# Patient Record
Sex: Female | Born: 1995 | Race: White | Hispanic: No | Marital: Single | Smoking: Never smoker
Health system: Southern US, Community
[De-identification: ages and names within clinical notes are randomized; demographics above are authoritative.]

---
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
## 2016-04-05 ENCOUNTER — Encounter (HOSPITAL_COMMUNITY): Payer: Self-pay | Admitting: *Deleted

## 2016-04-05 DIAGNOSIS — R1031 Right lower quadrant pain: Secondary | ICD-10-CM | POA: Diagnosis present

## 2016-04-05 DIAGNOSIS — Z3202 Encounter for pregnancy test, result negative: Secondary | ICD-10-CM | POA: Insufficient documentation

## 2016-04-05 DIAGNOSIS — N12 Tubulo-interstitial nephritis, not specified as acute or chronic: Secondary | ICD-10-CM | POA: Insufficient documentation

## 2016-04-05 LAB — COMPREHENSIVE METABOLIC PANEL
ALBUMIN: 4.4 g/dL (ref 3.5–5.0)
ALT: 15 U/L (ref 14–54)
ANION GAP: 9 (ref 5–15)
AST: 18 U/L (ref 15–41)
Alkaline Phosphatase: 57 U/L (ref 38–126)
BUN: 11 mg/dL (ref 6–20)
CHLORIDE: 105 mmol/L (ref 101–111)
CO2: 26 mmol/L (ref 22–32)
Calcium: 9.6 mg/dL (ref 8.9–10.3)
Creatinine, Ser: 0.67 mg/dL (ref 0.44–1.00)
GFR calc Af Amer: >60 mL/min (ref >60)
GFR calc non Af Amer: >60 mL/min (ref >60)
GLUCOSE: 92 mg/dL (ref 65–99)
POTASSIUM: 4.1 mmol/L (ref 3.5–5.1)
SODIUM: 140 mmol/L (ref 135–145)
TOTAL PROTEIN: 7.6 g/dL (ref 6.5–8.1)
Total Bilirubin: 0.5 mg/dL (ref 0.3–1.2)

## 2016-04-05 LAB — LIPASE, BLOOD: LIPASE: 21 U/L (ref 11–51)

## 2016-04-05 LAB — URINALYSIS, ROUTINE W REFLEX MICROSCOPIC
BILIRUBIN URINE: NEGATIVE
GLUCOSE, UA: NEGATIVE mg/dL
Ketones, ur: NEGATIVE mg/dL
NITRITE: NEGATIVE
PH: 6.5 (ref 5.0–8.0)
Protein, ur: 100 mg/dL — AB
SPECIFIC GRAVITY, URINE: 1.017 (ref 1.005–1.030)

## 2016-04-05 LAB — CBC
HEMATOCRIT: 35.9 % — AB (ref 36.0–46.0)
HEMOGLOBIN: 12.7 g/dL (ref 12.0–15.0)
MCH: 30.5 pg (ref 26.0–34.0)
MCHC: 35.4 g/dL (ref 30.0–36.0)
MCV: 86.1 fL (ref 78.0–100.0)
Platelets: 352 10*3/uL (ref 150–400)
RBC: 4.17 MIL/uL (ref 3.87–5.11)
RDW: 13.1 % (ref 11.5–15.5)
WBC: 8.6 10*3/uL (ref 4.0–10.5)

## 2016-04-05 LAB — URINE MICROSCOPIC-ADD ON

## 2016-04-05 NOTE — ED Notes (Signed)
Pt complains of RLQ all day, worse for the past 5 hours. Pt denies N/V/D. LMP was Monday. Pt still has appendix.

## 2016-04-06 ENCOUNTER — Emergency Department (HOSPITAL_COMMUNITY)
Admission: EM | Admit: 2016-04-06 | Discharge: 2016-04-06 | Disposition: A | Discharge status: Home / Self Care | Payer: BC Managed Care – PPO | Attending: Emergency Medicine | Admitting: Emergency Medicine

## 2016-04-06 ENCOUNTER — Encounter (HOSPITAL_COMMUNITY): Payer: Self-pay

## 2016-04-06 ENCOUNTER — Emergency Department (HOSPITAL_COMMUNITY): Payer: BC Managed Care – PPO

## 2016-04-06 DIAGNOSIS — N12 Tubulo-interstitial nephritis, not specified as acute or chronic: Secondary | ICD-10-CM

## 2016-04-06 LAB — PREGNANCY, URINE: PREG TEST UR: NEGATIVE

## 2016-04-06 MED ORDER — IBUPROFEN 600 MG PO TABS: 600.0000 mg | ORAL_TABLET | Freq: Four times a day (QID) | ORAL | Status: DC | PRN

## 2016-04-06 MED ORDER — SODIUM CHLORIDE 0.9 % IV BOLUS (SEPSIS)
1000.0000 mL | Freq: Once | INTRAVENOUS | Status: AC
  Administered 2016-04-06: 1000 mL via INTRAVENOUS

## 2016-04-06 MED ORDER — KETOROLAC TROMETHAMINE 30 MG/ML IJ SOLN
30.0000 mg | Freq: Once | INTRAMUSCULAR | Status: AC
  Administered 2016-04-06: 30 mg via INTRAVENOUS
  Filled 2016-04-06: qty 1

## 2016-04-06 MED ORDER — CEPHALEXIN 500 MG PO CAPS: 500.0000 mg | ORAL_CAPSULE | Freq: Four times a day (QID) | ORAL | Status: DC

## 2016-04-06 MED ORDER — IOPAMIDOL (ISOVUE-300) INJECTION 61%
100.0000 mL | Freq: Once | INTRAVENOUS | Status: AC | PRN
  Administered 2016-04-06: 100 mL via INTRAVENOUS

## 2016-04-06 MED ORDER — DEXTROSE 5 % IV SOLN
1.0000 g | Freq: Once | INTRAVENOUS | Status: AC
  Administered 2016-04-06: 1 g via INTRAVENOUS
  Filled 2016-04-06: qty 10

## 2016-04-06 NOTE — ED Notes (Signed)
Pt transported to CT ?

## 2016-04-06 NOTE — ED Provider Notes (Signed)
CSN: [id number]     Arrival date & time 04/05/16  2139 History   First MD Initiated Contact with Patient 04/06/16 0217     Chief Complaint  Patient presents with  . Abdominal Pain     (Consider location/radiation/quality/duration/timing/severity/associated sxs/prior Treatment) HPI Comments: 20 year old female with no significant past medical history presents to the emergency department for further evaluation of abdominal pain. Patient reports that she had some suprapubic cramping yesterday. This progressed to right lower quadrant and back pain at approximately 1600 today. Pain has been constant and waxing and waning in severity. Patient reports associated chills with a temperature of 5F. No known fever prior to arrival. Patient has not taken any medications for her symptoms today. She denies hematuria, dysuria, nausea, vomiting, vaginal bleeding or other vaginal complaints, or ever being sexually active. She has no personal history of kidney stones, though her father has had kidney stones. No history of abdominal surgeries.  Patient is a 20 y.o. female presenting with abdominal pain. The history is provided by the patient. No language interpreter was used.  Abdominal Pain   History reviewed. No pertinent past medical history. History reviewed. No pertinent past surgical history. Family History  Problem Relation Age of Onset  . Heart disease Maternal Grandmother   . Cancer Maternal Grandmother   . Heart disease Maternal Grandfather   . Cancer Paternal Grandmother   . Hypertension Paternal Grandmother    Social History  Substance Use Topics  . Smoking status: Never Smoker   . Smokeless tobacco: None  . Alcohol Use: No   OB History    No data available      Review of Systems  Gastrointestinal: Positive for abdominal pain.  Genitourinary: Positive for flank pain.  All other systems reviewed and are negative.   Allergies  Review of patient's allergies indicates no known  allergies.  Home Medications   Prior to Admission medications   Medication Sig Start Date End Date Taking? Authorizing Provider  cephALEXin (KEFLEX) 500 MG capsule Take 1 capsule (500 mg total) by mouth 4 (four) times daily. 04/06/16   Antony MaduraKelly Cleofas Hudgins, PA-C  ibuprofen (ADVIL,MOTRIN) 600 MG tablet Take 1 tablet (600 mg total) by mouth every 6 (six) hours as needed. 04/06/16   Antony MaduraKelly Aseret Hoffman, PA-C   BP 105/63 mmHg  Pulse 68  Temp(Src) 99 F (37.2 C) (Oral)  Resp 14  SpO2 99%  LMP 04/02/2016   Physical Exam  Constitutional: She is oriented to person, place, and time. She appears well-developed and well-nourished. No distress.  Nontoxic appearing. Pleasant.  HENT:  Head: Normocephalic and atraumatic.  Eyes: Conjunctivae and EOM are normal. No scleral icterus.  Neck: Normal range of motion.  Cardiovascular: Normal rate, regular rhythm and intact distal pulses.   Pulmonary/Chest: Effort normal. No respiratory distress. She has no wheezes. She has no rales.  Respirations even and unlabored. Lungs CTAB.  Abdominal: Soft. She exhibits no distension. There is tenderness. There is no rebound and no guarding.  Soft abdomen with tenderness to palpation in the right lower quadrant. There is positive CVA tenderness on the right. No masses or rigidity. No guarding appreciated. Negative Murphy sign.  Musculoskeletal: Normal range of motion.  Neurological: She is alert and oriented to person, place, and time. She exhibits normal muscle tone. Coordination normal.  Patient moving all extremities.  Skin: Skin is warm and dry. No rash noted. She is not diaphoretic. No erythema. No pallor.  Psychiatric: She has a normal mood and affect. Her behavior  is normal.  Nursing note and vitals reviewed.   ED Course  Procedures (including critical care time) Labs Review Labs Reviewed  CBC - Abnormal; Notable for the following:    HCT 35.9 (*)    All other components within normal limits  URINALYSIS, ROUTINE W  REFLEX MICROSCOPIC (NOT AT [facility]) - Abnormal; Notable for the following:    APPearance CLOUDY (*)    Hgb urine dipstick LARGE (*)    Protein, ur 100 (*)    Leukocytes, UA MODERATE (*)    All other components within normal limits  URINE MICROSCOPIC-ADD ON - Abnormal; Notable for the following:    Squamous Epithelial / LPF 0-5 (*)    Bacteria, UA FEW (*)    All other components within normal limits  URINE CULTURE  LIPASE, BLOOD  COMPREHENSIVE METABOLIC PANEL  PREGNANCY, URINE    Imaging Review Ct Abdomen Pelvis W Contrast  04/06/2016  CLINICAL DATA:  Right lower quadrant pain, worsening over the past several hours. EXAM: CT ABDOMEN AND PELVIS WITH CONTRAST TECHNIQUE: Multidetector CT imaging of the abdomen and pelvis was performed using the standard protocol following bolus administration of intravenous contrast. CONTRAST:  ISOVUE-300 IOPAMIDOL (ISOVUE-300) INJECTION 61% COMPARISON:  None. FINDINGS: Probable visualization of the normal appendix. No inflammatory changes in the region of the cecal tip. No acute inflammatory changes elsewhere in the abdomen or pelvis. There is no bowel obstruction. There is no extraluminal air. There are unremarkable appearances of the liver, biliary system, pancreas, spleen, adrenals and kidneys. Vasculature is unremarkable. There is no significant abnormality in the lower chest. There is no significant musculoskeletal abnormality. No urinary calculi. IMPRESSION: No acute findings are evident in the abdomen or pelvis. Probable visualization of a normal appendix. No urinary calculi. Electronically Signed   By: Ellery Plunk M.D.   On: 04/06/2016 04:20     I have personally reviewed and evaluated these images and lab results as part of my medical decision-making.   EKG Interpretation None       Medications  ketorolac (TORADOL) 30 MG/ML injection 30 mg (30 mg Intravenous Given 04/06/16 0300)  sodium chloride 0.9 % bolus 1,000 mL (0 mLs Intravenous  Stopped 04/06/16 0452)  cefTRIAXone (ROCEPHIN) 1 g in dextrose 5 % 50 mL IVPB (0 g Intravenous Stopped 04/06/16 0347)  iopamidol (ISOVUE-300) 61 % injection 100 mL (100 mLs Intravenous Contrast Given 04/06/16 0348)    MDM   Final diagnoses:  Pyelonephritis    20 year old female presents to the emergency department for evaluation of 2 days of suprapubic abdominal pain worsening to include right lower quadrant pain and right flank pain. Urinalysis today consistent with hemorrhagic cystitis. Kidney stone considered, but there is no evidence of urinary calculi on CT scan. Appendix is also visualized to be normal. Patient is afebrile and without leukocytosis. No associated N/V. Given flank pain, suspect early pyelonephritis. The patient has been given an initial dose of Rocephin in the emergency department. Plan to discharge with ibuprofen and Keflex. Pediatric follow-up advised and return precautions given. Patient and mother agreeable to plan with no unaddressed concerns. Patient discharged in good condition.   Filed Vitals:   04/05/16 2222 04/06/16 0302 04/06/16 0452  BP: 127/73 111/68 105/63  Pulse: 70 91 68  Temp: 99 F (37.2 C)    TempSrc: Oral    Resp: SpO2: 99% 97% 99%     Antony Madura, PA-C 04/06/16 1610  Dione Booze, MD 04/06/16 [phone]

## 2016-04-06 NOTE — Discharge Instructions (Signed)

## 2016-04-08 LAB — URINE CULTURE

## 2016-04-09 ENCOUNTER — Telehealth (HOSPITAL_BASED_OUTPATIENT_CLINIC_OR_DEPARTMENT_OTHER): Payer: Self-pay | Admitting: Emergency Medicine

## 2016-04-09 NOTE — Telephone Encounter (Signed)
Post ED Visit - Positive Culture Follow-up  Culture report reviewed by antimicrobial stewardship pharmacist:  []  Enzo BiNathan Batchelder, Pharm.D. []  Celedonio MiyamotoJeremy Frens, Pharm.D., BCPS []  Garvin FilaMike Maccia, Pharm.D. []  Georgina PillionElizabeth Martin, Pharm.D., BCPS []  ClearwaterMinh Pham, [address] Rainbow BoulevardPharm.D., BCPS, AAHIVP []  Estella HuskMichelle Turner, Pharm.D., BCPS, AAHIVP []  Tennis Mustassie Stewart, Pharm.D. []  Sherle Poeob Vincent, [place]. Meagan Decker PharmD  Positive urine culture Treated with cephalexin, organism sensitive to the same and no further patient follow-up is required at this time.  Berle MullMiller, Arleatha Philipps 04/09/2016, 11:12 AM

## 2016-05-02 ENCOUNTER — Ambulatory Visit (INDEPENDENT_AMBULATORY_CARE_PROVIDER_SITE_OTHER): Payer: BC Managed Care – PPO | Admitting: Physician Assistant

## 2016-05-02 VITALS — BP 106/72 | HR 122 | Temp 98.7°F | Resp 18 | Ht 67.0 in | Wt 161.0 lb

## 2016-05-02 DIAGNOSIS — J029 Acute pharyngitis, unspecified: Secondary | ICD-10-CM

## 2016-05-02 DIAGNOSIS — R6889 Other general symptoms and signs: Secondary | ICD-10-CM | POA: Diagnosis not present

## 2016-05-02 LAB — POCT INFLUENZA A/B
INFLUENZA B, POC: NEGATIVE
Influenza A, POC: NEGATIVE

## 2016-05-02 LAB — POCT RAPID STREP A (OFFICE): RAPID STREP A SCREEN: NEGATIVE

## 2016-05-02 NOTE — Patient Instructions (Addendum)
Mucinex for congestion Rest Tylenol or motrin for fever and body aches    IF you received an x-ray today, you will receive an invoice from [facility]. Please contact [facility] at [phone] with questions or concerns regarding your invoice.   IF you received labwork today, you will receive an invoice from [facility]. Please contact [facility] at [phone] with questions or concerns regarding your invoice.   Our billing staff will not be able to assist you with questions regarding bills from these companies.  You will be contacted with the lab results as soon as they are available. The fastest way to get your results is to activate your My Chart account. Instructions are located on the last page of this paperwork. If you have not heard from us regarding the results in 2 weeks, please contact this office.

## 2016-05-02 NOTE — Progress Notes (Signed)
   Jacqueline Barrett  MRN: [id number] DOB: 07/04/1996  Subjective:  Pt presents to clinic with 5 days h/o not feeling good.  Started with a fever 4 days ago that has not resolved.  She moved out of [place] the day she started to get sick.  She has had intermittent fevers with sore throat and congestion with minimal rhinorrhea.  She had had some nausea but she has been sleeping a lot.  She has had headaches and myalgias.  Mother would like to have her tested for the Flu because the patient's grandfather is now sick with similar symptoms.  Hone treatment - tylenol and advil sick - no strep contacts and now flu contacts is over a month  There are no active problems to display for this patient.   No current outpatient prescriptions on file prior to visit.   No current facility-administered medications on file prior to visit.    No Known Allergies  Review of Systems  Constitutional: Positive for fever (102), chills and appetite change.  HENT: Positive for congestion, postnasal drip and sore throat. Negative for rhinorrhea.   Respiratory: Positive for cough.   Gastrointestinal: Positive for nausea. Negative for diarrhea and constipation.  Musculoskeletal: Positive for myalgias.  Neurological: Positive for headaches.   Objective:  BP 106/72 mmHg  Pulse 122  Temp(Src) 98.7 F (37.1 C) (Oral)  Resp 18  Ht 5\' 7"  (1.702 m)  Wt 161 lb (73.029 kg)  BMI 25.21 kg/m2  SpO2 95%  LMP 03/29/2016  Physical Exam  Constitutional: She is oriented to person, place, and time and well-developed, well-nourished, and in no distress.  HENT:  Head: Normocephalic and atraumatic.  Right Ear: Hearing, tympanic membrane, external ear and ear canal normal.  Left Ear: Hearing, tympanic membrane, external ear and ear canal normal.  Nose: Nose normal.  Mouth/Throat: Uvula is midline, oropharynx is clear and moist and mucous membranes are normal.  Eyes: Conjunctivae are normal.  Neck: Normal range of motion.   Cardiovascular: Normal rate, regular rhythm and normal heart sounds.   No murmur heard. Pulmonary/Chest: Effort normal and breath sounds normal.  Neurological: She is alert and oriented to person, place, and time. Gait normal.  Skin: Skin is warm and dry.  Psychiatric: Mood, memory, affect and judgment normal.  Vitals reviewed.  Results for orders placed or performed in visit on 05/02/16  POCT Influenza A/B  Result Value Ref Range   Influenza A, POC Negative Negative   Influenza B, POC Negative Negative  POCT rapid strep A  Result Value Ref Range   Rapid Strep A Screen Negative Negative    Assessment and Plan :  Sore throat - Plan: POCT rapid strep A  Flu-like symptoms - Plan: POCT Influenza A/B   Symptomatic care with mucinex and NSAIDs for the fever.  Questions were answered.  Benny LennertSarah Jerrick Farve PA-C  [facility] 05/02/2016 10:25 AM

## 2016-11-14 ENCOUNTER — Encounter: Payer: Self-pay | Admitting: Family Medicine

## 2016-11-14 ENCOUNTER — Ambulatory Visit (INDEPENDENT_AMBULATORY_CARE_PROVIDER_SITE_OTHER): Payer: BC Managed Care – PPO | Admitting: Family Medicine

## 2016-11-14 VITALS — BP 110/60 | HR 60 | Temp 98.7°F | Ht 67.0 in | Wt 165.4 lb

## 2016-11-14 DIAGNOSIS — Z23 Encounter for immunization: Secondary | ICD-10-CM | POA: Diagnosis not present

## 2016-11-14 DIAGNOSIS — R21 Rash and other nonspecific skin eruption: Secondary | ICD-10-CM | POA: Diagnosis not present

## 2016-11-14 DIAGNOSIS — Z131 Encounter for screening for diabetes mellitus: Secondary | ICD-10-CM | POA: Diagnosis not present

## 2016-11-14 DIAGNOSIS — Z0000 Encounter for general adult medical examination without abnormal findings: Secondary | ICD-10-CM

## 2016-11-14 DIAGNOSIS — N946 Dysmenorrhea, unspecified: Secondary | ICD-10-CM

## 2016-11-14 DIAGNOSIS — Z13220 Encounter for screening for lipoid disorders: Secondary | ICD-10-CM

## 2016-11-14 LAB — POCT URINALYSIS DIP (MANUAL ENTRY)
BILIRUBIN UA: NEGATIVE
BILIRUBIN UA: NEGATIVE
Glucose, UA: NEGATIVE
Leukocytes, UA: NEGATIVE
Nitrite, UA: NEGATIVE
PH UA: 7
Protein Ur, POC: NEGATIVE
Spec Grav, UA: 1.02
Urobilinogen, UA: 0.2

## 2016-11-14 MED ORDER — NORETHINDRONE ACET-ETHINYL EST 1-20 MG-MCG PO TABS: 1.0000 | ORAL_TABLET | Freq: Every day | ORAL | 3 refills | Status: DC

## 2016-11-14 NOTE — Progress Notes (Signed)
Subjective:    Patient ID: Jacqueline Barrett, female    DOB: 09/08/1996, 20 y.o.   MRN: [id number]  11/14/2016  Annual Exam   HPI This 20 y.o. female presents for Complete Physical Examination.  Last physical: 06/2014 TDAP: Gardisil: in past. Influenza: requesting Eye exam:  Never; no glasses or contacts Dental exam:  Every year First day of menses has moderate cramping and heavy menses.  Seven days of menses. Regular.   Immunization History  Administered Date(s) Administered  . Influenza,inj,Quad PF,36+ Mos 11/14/2016   Wt Readings from Last 3 Encounters:  11/14/16 165 lb 6.4 oz (75 kg) (90 %, Z= 1.29)*  05/02/16 161 lb (73 kg) (89 %, Z= 1.22)*  07/22/14 156 lb 3.2 oz (70.9 kg) (89 %, Z= 1.23)*   * Growth percentiles are based on CDC 2-20 Years data.   BP Readings from Last 3 Encounters:  11/14/16 110/60  05/02/16 106/72  04/06/16 105/63    No exam data present  UTI in 03/2016; s/p CT scan WNL.  Inflamed rash: recommended taking Zyrtec 10mg  daily; very drowsy.  No sensitive skin.  Dove soap for sensitive skin.  Also happens on face.  Not seasonal; yearround. Onset in high school.  Review of Systems  Constitutional: Negative for activity change, appetite change, chills, diaphoresis, fatigue, fever and unexpected weight change.  HENT: Negative for congestion, dental problem, drooling, ear discharge, ear pain, facial swelling, hearing loss, mouth sores, nosebleeds, postnasal drip, rhinorrhea, sinus pressure, sneezing, sore throat, tinnitus, trouble swallowing and voice change.   Eyes: Negative for photophobia, pain, discharge, redness, itching and visual disturbance.  Respiratory: Negative for apnea, cough, choking, chest tightness, shortness of breath, wheezing and stridor.   Cardiovascular: Negative for chest pain, palpitations and leg swelling.  Gastrointestinal: Positive for abdominal pain and nausea. Negative for abdominal distention, anal bleeding, blood in stool,  constipation, diarrhea, rectal pain and vomiting.  Endocrine: Negative for cold intolerance, heat intolerance, polydipsia, polyphagia and polyuria.  Genitourinary: Negative for decreased urine volume, difficulty urinating, dyspareunia, dysuria, enuresis, flank pain, frequency, genital sores, hematuria, menstrual problem, pelvic pain, urgency, vaginal bleeding, vaginal discharge and vaginal pain.  Musculoskeletal: Negative for arthralgias, back pain, gait problem, joint swelling, myalgias, neck pain and neck stiffness.  Skin: Positive for color change and rash. Negative for pallor and wound.  Allergic/Immunologic: Negative for environmental allergies, food allergies and immunocompromised state.  Neurological: Negative for dizziness, tremors, seizures, syncope, facial asymmetry, speech difficulty, weakness, light-headedness, numbness and headaches.  Hematological: Negative for adenopathy. Does not bruise/bleed easily.  Psychiatric/Behavioral: Negative for agitation, behavioral problems, confusion, decreased concentration, dysphoric mood, hallucinations, self-injury, sleep disturbance and suicidal ideas. The patient is not nervous/anxious and is not hyperactive.        Bedtime 11:00pm; wakes up 8:30am.    History reviewed. No pertinent past medical history. History reviewed. No pertinent surgical history. No Known Allergies  Social History   Social History  . Marital status: Single    Spouse name: N/A  . Number of children: N/A  . Years of education: N/A   Occupational History  . Not on file.   Social History Main Topics  . Smoking status: Never Smoker  . Smokeless tobacco: Never Used  . Alcohol use No  . Drug use: No  . Sexual activity: No   Other Topics Concern  . 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      Tobacco; none      Alcohol: socially      Drugs: none      Seatbelt: 100%; no texting      Exercise: running; two days per week.      Sexual activity:    Family History  Problem Relation Age of Onset  . Mental illness Father     bipolar  . Diabetes Father   . Arthritis Father   . Heart disease Maternal Grandmother   . Cancer Maternal Grandmother   . Heart disease Maternal Grandfather   . Cancer Paternal Grandmother   . Hypertension Paternal Grandmother        Objective:    BP 110/60 (BP Location: Right Arm, Patient Position: Sitting, Cuff Size: Small)   Pulse 60   Temp 98.7 F (37.1 C) (Oral)   Ht 5\' 7"  (1.702 m)   Wt 165 lb 6.4 oz (75 kg)   BMI 25.91 kg/m  Physical Exam  Constitutional: She is oriented to person, place, and time. She appears well-developed and well-nourished. No distress.  HENT:  Head: Normocephalic and atraumatic.  Right Ear: External ear normal.  Left Ear: External ear normal.  Nose: Nose normal.  Mouth/Throat: Oropharynx is clear and moist.  Eyes: Conjunctivae and EOM are normal. Pupils are equal, round, and reactive to light.  Neck: Normal range of motion and full passive range of motion without pain. Neck supple. No JVD present. Carotid bruit is not present. No thyromegaly present.  Cardiovascular: Normal rate, regular rhythm and normal heart sounds.  Exam reveals no gallop and no friction rub.   No murmur heard. Pulmonary/Chest: Effort normal and breath sounds normal. She has no wheezes. She has no rales.  Abdominal: Soft. Bowel sounds are normal. She exhibits no distension and no mass. There is no tenderness. There is no rebound and no guarding.  Musculoskeletal:       Right shoulder: Normal.       Left shoulder: Normal.       Cervical back: Normal.  Lymphadenopathy:    She has no cervical adenopathy.  Neurological: She is alert and oriented to person, place, and  time. She has normal reflexes. No cranial nerve deficit. She exhibits normal muscle tone. Coordination normal.  Skin: Skin is warm and dry. No rash noted. She is not diaphoretic. No erythema. No pallor.  Psychiatric: She has a normal mood and affect. Her behavior is normal. Judgment and thought content normal.  Nursing note and vitals reviewed.       Assessment & Plan:   1. Routine physical examination   2. Need for prophylactic vaccination and inoculation against influenza   3. Screening, lipid   4. Screening for diabetes mellitus   5. Dysmenorrhea   6. Rash    -anticipatory guidance --- exercise, weight loss, safe sex and driving practices. -s/p flu vaccine. -obtain labs. -rx for OCP provided due to dysmenorrhea. -recommend Claritin or Zyrtec daily for allergic related rash.   Orders Placed This Encounter  Procedures  . Flu Vaccine QUAD 36+ mos PF IM (Fluarix & Fluzone Quad PF)  . CBC with Differential/Platelet  . Comprehensive metabolic panel    Order Specific Question:   Has the patient fasted?    Answer:   Yes  . Lipid panel  Order Specific Question:   Has the patient fasted?    Answer:   Yes  . TSH  . POCT urinalysis dipstick   Meds ordered this encounter  Medications  . norethindrone-ethinyl estradiol (MICROGESTIN,JUNEL,LOESTRIN) 1-20 MG-MCG tablet    Sig: Take 1 tablet by mouth daily.    Dispense:  3 Package    Refill:  3    No Follow-up on file.   Kristi Paulita Fujita, M.D. [facility] [address] [place], [place]  [address] [phone] phone [phone] fax

## 2016-11-14 NOTE — Patient Instructions (Addendum)
   IF you received an x-ray today, you will receive an invoice from [facility]. Please contact [facility] at [phone] with questions or concerns regarding your invoice.   IF you received labwork today, you will receive an invoice from [facility]. Please contact [facility] at [phone] with questions or concerns regarding your invoice.   Our billing staff will not be able to assist you with questions regarding bills from these companies.  You will be contacted with the lab results as soon as they are available. The fastest way to get your results is to activate your My Chart account. Instructions are located on the last page of this paperwork. If you have not heard from us regarding the results in 2 weeks, please contact this office.     Keeping You Healthy  Get These Tests 1. Blood Pressure- Have your blood pressure checked once a year by your health care provider.  Normal blood pressure is 120/80. 2. Weight- Have your body mass index (BMI) calculated to screen for obesity.  BMI is measure of body fat based on height and weight.  You can also calculate your own BMI at www.nhlbisupport.com/bmi/. 3. Cholesterol- Have your cholesterol checked every 5 years starting at age 20 then yearly starting at age 45. 4. Chlamydia, HIV, and other sexually transmitted diseases- Get screened every year until age 25, then within three months of each new sexual provider. 5. Pap Test - Every 1-5 years; discuss with your health care provider. 6. Mammogram- Every 1-2 years starting at age 40--50  Take these medicines  Calcium with Vitamin D-Your body needs 1200 mg of Calcium each day and 800-1000 IU of Vitamin D daily.  Your body can only absorb 500 mg of Calcium at a time so Calcium must be taken in 2 or 3 divided doses throughout the day.  Multivitamin with folic acid- Once daily if it is possible for you to become pregnant.  Get these  Immunizations  Gardasil-Series of three doses; prevents HPV related illness such as genital warts and cervical cancer.  Menactra-Single dose; prevents meningitis.  Tetanus shot- Every 10 years.  Flu shot-Every year.  Take these steps 1. Do not smoke-Your healthcare provider can help you quit.  For tips on how to quit go to www.smokefree.gov or call 1-800 QUITNOW. 2. Be physically active- Exercise 5 days a week for at least 30 minutes.  If you are not already physically active, start slow and gradually work up to 30 minutes of moderate physical activity.  Examples of moderate activity include walking briskly, dancing, swimming, bicycling, etc. 3. Breast Cancer- A self breast exam every month is important for early detection of breast cancer.  For more information and instruction on self breast exams, ask your healthcare provider or www.womenshealth.gov/faq/breast-self-exam.cfm. 4. Eat a healthy diet- Eat a variety of healthy foods such as fruits, vegetables, whole grains, low fat milk, low fat cheeses, yogurt, lean meats, poultry and fish, beans, nuts, tofu, etc.  For more information go to www. Thenutritionsource.org 5. Drink alcohol in moderation- Limit alcohol intake to one drink or less per day. Never drink and drive. 6. Depression- Your emotional health is as important as your physical health.  If you're feeling down or losing interest in things you normally enjoy please talk to your healthcare provider about being screened for depression. 7. Dental visit- Brush and floss your teeth twice daily; visit your dentist twice a year. 8. Eye doctor- Get an eye exam at least every   2 years. 9. Helmet use- Always wear a helmet when riding a bicycle, motorcycle, rollerblading or skateboarding. 10. Safe sex- If you may be exposed to sexually transmitted infections, use a condom. 11. Seat belts- Seat belts can save your live; always wear one. 12. Smoke/Carbon Monoxide detectors- These detectors need to  be installed on the appropriate level of your home. Replace batteries at least once a year. 13. Skin cancer- When out in the sun please cover up and use sunscreen 15 SPF or higher. 14. Violence- If anyone is threatening or hurting you, please tell your healthcare provider.        

## 2016-11-21 LAB — LIPID PANEL
CHOL/HDL RATIO: 2 ratio (ref <5.0)
Cholesterol: 158 mg/dL (ref <170)
HDL: 78 mg/dL (ref >45)
LDL Cholesterol: 69 mg/dL (ref <110)
Triglycerides: 55 mg/dL (ref <90)
VLDL: 11 mg/dL (ref <30)

## 2016-11-21 LAB — COMPREHENSIVE METABOLIC PANEL
ALK PHOS: 49 U/L (ref 47–176)
ALT: 13 U/L (ref 5–32)
AST: 19 U/L (ref 12–32)
Albumin: 4.4 g/dL (ref 3.6–5.1)
BILIRUBIN TOTAL: 0.4 mg/dL (ref 0.2–1.1)
BUN: 9 mg/dL (ref 7–20)
CO2: 24 mmol/L (ref 20–31)
CREATININE: 0.69 mg/dL (ref 0.50–1.00)
Calcium: 9.6 mg/dL (ref 8.9–10.4)
Chloride: 106 mmol/L (ref 98–110)
GLUCOSE: 70 mg/dL (ref 65–99)
POTASSIUM: 4.3 mmol/L (ref 3.8–5.1)
SODIUM: 139 mmol/L (ref 135–146)
TOTAL PROTEIN: 7.2 g/dL (ref 6.3–8.2)

## 2016-11-21 LAB — TSH: TSH: 1.62 m[IU]/L (ref 0.50–4.30)

## 2016-11-22 LAB — CBC WITH DIFFERENTIAL/PLATELET

## 2017-03-01 ENCOUNTER — Ambulatory Visit (INDEPENDENT_AMBULATORY_CARE_PROVIDER_SITE_OTHER): Payer: BC Managed Care – PPO | Admitting: Family Medicine

## 2017-03-01 VITALS — BP 115/74 | HR 68 | Temp 98.0°F | Resp 16 | Ht 67.0 in | Wt 167.0 lb

## 2017-03-01 DIAGNOSIS — J069 Acute upper respiratory infection, unspecified: Secondary | ICD-10-CM | POA: Diagnosis not present

## 2017-03-01 DIAGNOSIS — R0981 Nasal congestion: Secondary | ICD-10-CM | POA: Diagnosis not present

## 2017-03-01 MED ORDER — FLUTICASONE PROPIONATE 50 MCG/ACT NA SUSP: 2.0000 | Freq: Every day | NASAL | 6 refills | Status: AC

## 2017-03-01 NOTE — Progress Notes (Signed)
  Chief Complaint  Patient presents with  . Nasal Congestion    past couple of days with an headache  . Generalized Body Aches  . Sore Throat    HPI   Pt started spring break and noted some low grade fevers overnight she reports that her temperatures were mildly elevated with chills She reports that she does not feel like she has the flu and reports getting a flu shot She reports sinus pressure She denies a history of allergic rhinitis She reports that she is coughing yellow sputum She is drinking water She is taking OTC nyquil and advil. She has not taken any medications today She denies history of asthma She is a nonsmoker She denies a history of sinus surgeries  History reviewed. No pertinent past medical history.  Current Outpatient Prescriptions  Medication Sig Dispense Refill  . norethindrone-ethinyl estradiol (MICROGESTIN,JUNEL,LOESTRIN) 1-20 MG-MCG tablet Take 1 tablet by mouth daily. 3 Package 3  . fluticasone (FLONASE) 50 MCG/ACT nasal spray Place 2 sprays into both nostrils daily. 16 g 6   No current facility-administered medications for this visit.     Allergies: No Known Allergies  History reviewed. No pertinent surgical history.  Social History   Social History  . Marital status: Single    Spouse name: N/A  . Number of children: N/A  . Years of education: N/A   Social History Main Topics  . Smoking status: Never Smoker  . Smokeless tobacco: Never Used  . Alcohol use No  . Drug use: No  . Sexual activity: No   Other Topics Concern  . 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      Tobacco; none      Alcohol: socially      Drugs: none      Seatbelt: 100%; no texting      Exercise: running; two days per week.    Sexual activity:     ROS See hpi  Objective: Vitals:   03/01/17 1535  BP: 115/74  Pulse: 68  Resp: 16  Temp: 98 F (36.7 C)  TempSrc: Oral  SpO2: 99%  Weight: 167 lb (75.8 kg)  Height: 5\' 7"  (1.702 m)    Physical Exam General: alert, oriented, in NAD Head: normocephalic, atraumatic, no sinus tenderness Eyes: EOM intact, no scleral icterus or conjunctival injection Ears: TM clear bilaterally Throat: no pharyngeal exudate or erythema Lymph: no posterior auricular, submental or cervical lymph adenopathy Heart: normal rate, normal sinus rhythm, no murmurs Lungs: clear to auscultation bilaterally, no wheezing   Assessment and Plan Ricki Rodriguezdriana was seen today for nasal congestion, generalized body aches and sore throat.  Diagnoses and all orders for this visit:  Sinus congestion Acute URI -    Discussed viral etiology Discussed otc decongestant Advised flonase for congestion  Increase hydration Vitamin C and zinc lozenges suggested Return to clinic if symptoms worse  Other orders -     fluticasone (FLONASE) 50 MCG/ACT nasal spray; Place 2 sprays into both nostrils daily.     Annelies Coyt A Giordano Getman

## 2017-03-01 NOTE — Patient Instructions (Addendum)
     IF you received an x-ray today, you will receive an invoice from [facility]. Please contact [facility] at [phone] with questions or concerns regarding your invoice.   IF you received labwork today, you will receive an invoice from [facility]. Please contact [facility] at [phone] with questions or concerns regarding your invoice.   Our billing staff will not be able to assist you with questions regarding bills from these companies.  You will be contacted with the lab results as soon as they are available. The fastest way to get your results is to activate your My Chart account. Instructions are located on the last page of this paperwork. If you have not heard from us regarding the results in 2 weeks, please contact this office.      Sinus Rinse What is a sinus rinse? A sinus rinse is a home treatment. It rinses your sinuses with a mixture of salt and water (saline solution). Sinuses are air-filled spaces in your skull behind the bones of your face and forehead. They open into your nasal cavity. To do a sinus rinse, you will need:  Saline solution.  Neti pot or spray bottle. This releases the saline solution into your nose and through your sinuses. You can buy neti pots and spray bottles at:  Your local pharmacy.  A health food store.  Online. When should I do a sinus rinse? A sinus rinse can help to clear your nasal cavity. It can clear:  Mucus.  Dirt.  Dust.  Pollen. You may do a sinus rinse when you have:  A cold.  A virus.  Allergies.  A sinus infection.  A stuffy nose. If you are considering a sinus rinse:  Ask your child's doctor before doing a sinus rinse on your child.  Do not do a sinus rinse if you have had:  Ear or nasal surgery.  An ear infection.  Blocked ears. How do I do a sinus rinse?  Wash your hands.  Disinfect your device using the directions that came with the device.  Dry your device.  Use the  solution that comes with your device or one that is sold separately in stores. Follow the mixing directions on the package.  Fill your device with the amount of saline solution as stated in the device instructions.  Stand over a sink and tilt your head sideways over the sink.  Place the spout of the device in your upper nostril (the one closer to the ceiling).  Gently pour or squeeze the saline solution into the nasal cavity. The liquid should drain to the lower nostril if you are not too congested.  Gently blow your nose. Blowing too hard may cause ear pain.  Repeat in the other nostril.  Clean and rinse your device with clean water.  Air-dry your device. Are there risks of a sinus rinse? Sinus rinse is normally very safe and helpful. However, there are a few risks, which include:  A burning feeling in the sinuses. This may happen if you do not make the saline solution as instructed. Make sure to follow all directions when making the saline solution.  Infection from unclean water. This is rare, but possible.  Nasal irritation. This information is not intended to replace advice given to you by your health care provider. Make sure you discuss any questions you have with your health care provider. Document Released: 07/07/2014 Document Revised: 11/06/2016 Document Reviewed: 04/27/2014 Elsevier Interactive Patient Education  2017 [employer]

## 2017-03-15 ENCOUNTER — Encounter: Payer: Self-pay | Admitting: Family Medicine

## 2017-06-29 ENCOUNTER — Ambulatory Visit (INDEPENDENT_AMBULATORY_CARE_PROVIDER_SITE_OTHER): Payer: BC Managed Care – PPO | Admitting: Family Medicine

## 2017-06-29 ENCOUNTER — Encounter: Payer: Self-pay | Admitting: Family Medicine

## 2017-06-29 VITALS — BP 122/76 | Temp 98.0°F | Resp 18 | Ht 67.32 in | Wt 166.0 lb

## 2017-06-29 DIAGNOSIS — N939 Abnormal uterine and vaginal bleeding, unspecified: Secondary | ICD-10-CM

## 2017-06-29 DIAGNOSIS — R238 Other skin changes: Secondary | ICD-10-CM

## 2017-06-29 DIAGNOSIS — Z3009 Encounter for other general counseling and advice on contraception: Secondary | ICD-10-CM | POA: Diagnosis not present

## 2017-06-29 MED ORDER — LEVONORGEST-ETH ESTRAD 91-DAY 0.15-0.03 &0.01 MG PO TABS: 1.0000 | ORAL_TABLET | Freq: Every day | ORAL | 4 refills | Status: DC

## 2017-06-29 MED ORDER — LEVONORGEST-ETH ESTRAD 91-DAY 0.15-0.03 &0.01 MG PO TABS
1.0000 | ORAL_TABLET | Freq: Every day | ORAL | 4 refills | Status: DC
Start: 2017-06-29 — End: 2018-07-25

## 2017-06-29 NOTE — Progress Notes (Signed)
Subjective:    Patient ID: Jacqueline Barrett, female    DOB: [birth year], 21 y.o.   MRN: [id number]  06/29/2017  Contraception (talk about birthcontroll)   HPI This 21 y.o. female presents for evaluation of contraception advise/counseling.  Having break through bleeding on current OCP almost every month.  Desires to avoid break through bleeding. Sister also skips months with menses and pt very interested in continuous cycling of OCP.  Mother also has several questions regarding safety of OCPs.  Rash: pt also gets blotchy skin intermittently; worried about etiology; can occur when barely scratches face or neck.  Can occur with stress also.  Review of Systems  Constitutional: Negative for chills, diaphoresis, fatigue and fever.  HENT: Negative for congestion, postnasal drip and rhinorrhea.   Eyes: Negative for visual disturbance.  Respiratory: Negative for cough and shortness of breath.   Cardiovascular: Negative for chest pain, palpitations and leg swelling.  Gastrointestinal: Negative for abdominal pain, constipation, diarrhea, nausea and vomiting.  Endocrine: Negative for cold intolerance, heat intolerance, polydipsia, polyphagia and polyuria.  Genitourinary: Positive for menstrual problem and vaginal bleeding.  Skin: Positive for color change.  Neurological: Negative for dizziness, tremors, seizures, syncope, facial asymmetry, speech difficulty, weakness, light-headedness, numbness and headaches.  Psychiatric/Behavioral: Negative for dysphoric mood. The patient is not nervous/anxious.     History reviewed. No pertinent past medical history. History reviewed. No pertinent surgical history. No Known Allergies  Social History   Social History  . Marital status: Single    Spouse name: N/A  . Number of children: N/A  . Years of education: N/A   Occupational History  . Not on file.   Social History Main Topics  . Smoking status: Never Smoker  . Smokeless tobacco: Never Used  .  Alcohol use No  . Drug use: No  . Sexual activity: No   Other Topics Concern  . 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      Tobacco; none      Alcohol: socially      Drugs: none      Seatbelt: 100%; no texting      Exercise: running; two days per week.      Sexual activity:    Family History  Problem Relation Age of Onset  . Mental illness Father        bipolar  . Diabetes Father   . Arthritis Father   . Heart disease Maternal Grandmother   . Cancer Maternal Grandmother   . Heart disease Maternal Grandfather   . Cancer Paternal Grandmother   . Hypertension Paternal Grandmother        Objective:    BP 122/76   Temp 98 F (36.7 C) (Oral)   Resp 18   Ht 5' 7.32" (1.71 m)   Wt 166 lb (75.3 kg)   LMP 06/28/2017 (Approximate)   SpO2 100%   BMI 25.75 kg/m  Physical Exam  Constitutional: She is oriented to person, place, and time. She appears well-developed and well-nourished. No distress.  HENT:  Head: Normocephalic and atraumatic.  Right Ear: External ear normal.  Left Ear: External ear normal.  Nose: Nose normal.  Mouth/Throat: Oropharynx is clear and moist.  Eyes:  Conjunctivae and EOM are normal. Pupils are equal, round, and reactive to light.  Neck: Normal range of motion. Neck supple. Carotid bruit is not present. No thyromegaly present.  Cardiovascular: Normal rate, regular rhythm, normal heart sounds and intact distal pulses.  Exam reveals no gallop and no friction rub.   No murmur heard. Pulmonary/Chest: Effort normal and breath sounds normal. She has no wheezes. She has no rales.  Abdominal: Soft. Bowel sounds are normal. She exhibits no distension and no mass. There is no tenderness. There is no rebound and no  guarding.  Lymphadenopathy:    She has no cervical adenopathy.  Neurological: She is alert and oriented to person, place, and time. No cranial nerve deficit.  Skin: Skin is warm and dry. No rash noted. She is not diaphoretic. No erythema. No pallor.  Psychiatric: She has a normal mood and affect. Her behavior is normal.   Depression screen [facility] 2/[facility] 06/29/2017 03/01/2017 11/14/2016 05/02/2016  Decreased Interest 0 0 0 0  Down, Depressed, Hopeless 0 0 0 0  PHQ - 2 Score 0 0 0 0        Assessment & Plan:   1. Abnormal vaginal bleeding   2. Encounter for other general counseling or advice on contraception   3. Skin sensitivity    -new onset irregular vaginal bleeding for past year with current OCP; will switch to different OCP.  No other sites of bleeding; no other symptoms with irregular bleeding. -if persists, will warrant pelvic exam. -discussed pap smear guidelines with patient and mother. -ongoing skin sensitivity with scratching and stress; recommend trial of Zyrtec or Claritin daily to treat eczema or allergic component.  No orders of the defined types were placed in this encounter.  Meds ordered this encounter  Medications  . DISCONTD: Levonorgestrel-Ethinyl Estradiol (AMETHIA,CAMRESE) 0.15-0.03 &0.01 MG tablet    Sig: Take 1 tablet by mouth daily.    Dispense:  1 Package    Refill:  4  . Levonorgestrel-Ethinyl Estradiol (AMETHIA,CAMRESE) 0.15-0.03 &0.01 MG tablet    Sig: Take 1 tablet by mouth daily.    Dispense:  1 Package    Refill:  4    No Follow-up on file.   Mohid Furuya Paulita FujitaMartin Cendy Oconnor, M.D. [facility]  [facility] [address] [place], [place]  [id number] [phone] phone [phone] fax

## 2017-06-29 NOTE — Patient Instructions (Addendum)
   START CLARITIN OR ALLEGRA DAILY.   IF you received an x-ray today, you will receive an invoice from [facility]. Please contact [facility] at [phone] with questions or concerns regarding your invoice.   IF you received labwork today, you will receive an invoice from [facility]. Please contact [facility] at [phone] with questions or concerns regarding your invoice.   Our billing staff will not be able to assist you with questions regarding bills from these companies.  You will be contacted with the lab results as soon as they are available. The fastest way to get your results is to activate your My Chart account. Instructions are located on the last page of this paperwork. If you have not heard from us regarding the results in 2 weeks, please contact this office.

## 2017-08-30 IMAGING — CT CT ABD-PELV W/ CM
2 of 4 series · 16 of 46 positions shown, 18 images · IV contrast (ISOVUE 300)
Comparison: None.

CLINICAL DATA: Right lower quadrant pain, worsening over the past
several hours.

EXAM:
CT ABDOMEN AND PELVIS WITH CONTRAST
TECHNIQUE: Multidetector CT imaging of the abdomen and pelvis was performed
using the standard protocol following bolus administration of
intravenous contrast.
CONTRAST:  100mL 48KCDF-ICC IOPAMIDOL (48KCDF-ICC) INJECTION 61%

[Series 2: abd/pel with · axial · 0.65mm/px · z∈[+960,+1390]mm · 13 of 94 slices shown, 15 images]
[im 4/94  soft-tissue]
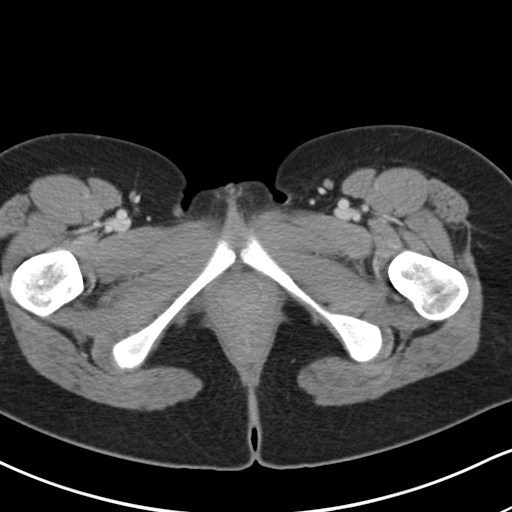
[im 4/94  bone]
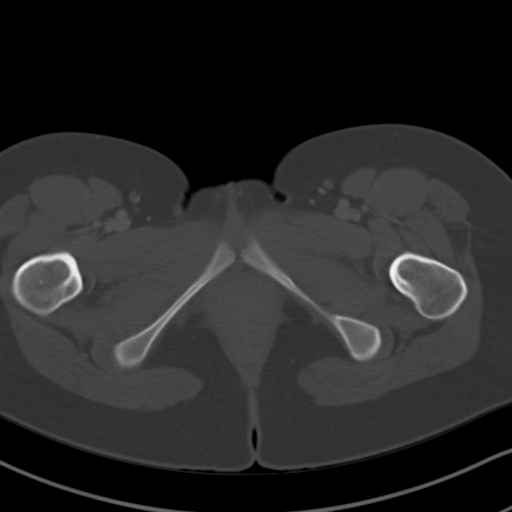
[im 12/94  soft-tissue]
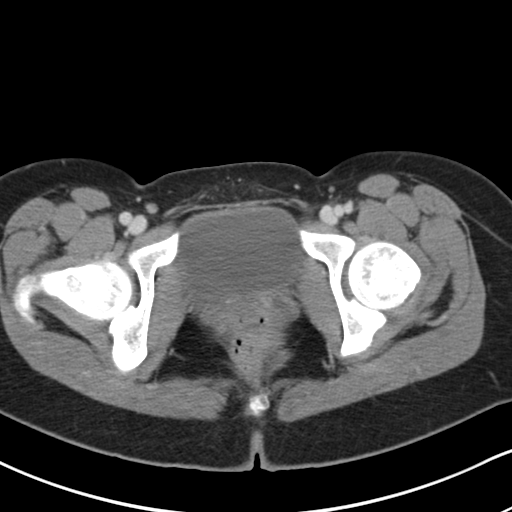
[im 20/94  soft-tissue]
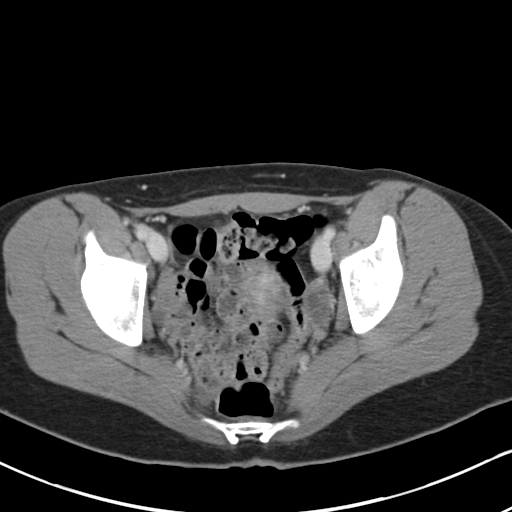
[im 28/94  soft-tissue]
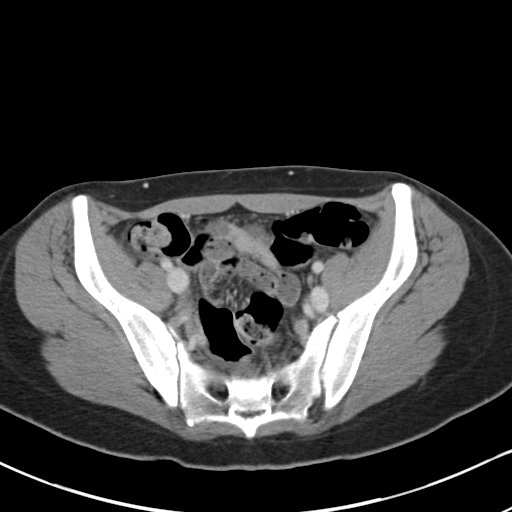
[im 32/94  soft-tissue]
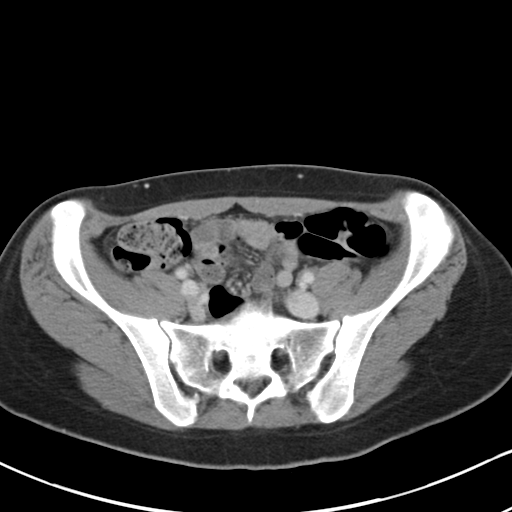
[im 39/94  soft-tissue]
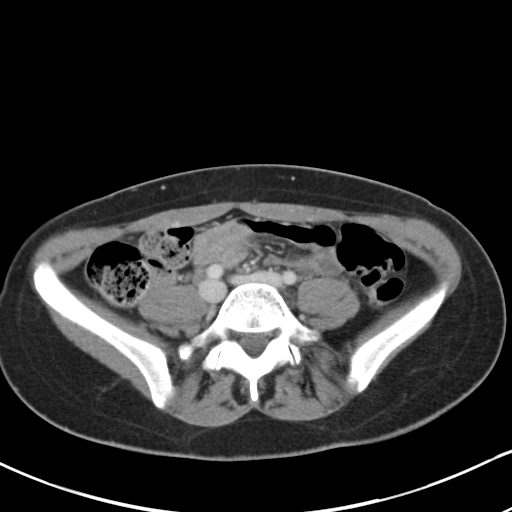
[im 47/94  soft-tissue]
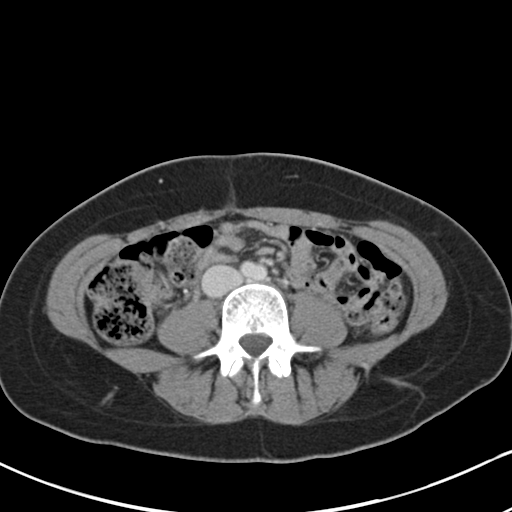
[im 55/94  soft-tissue]
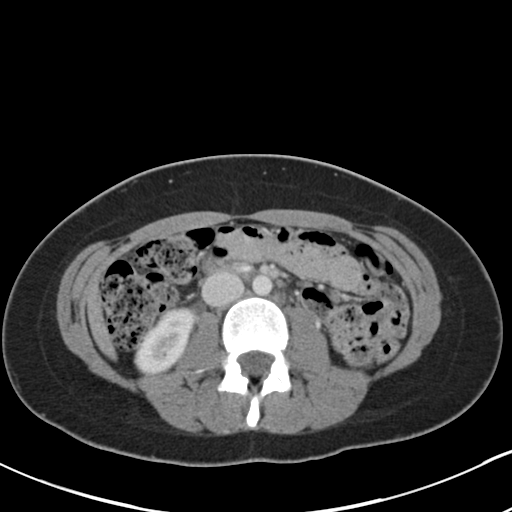
[im 63/94  soft-tissue]
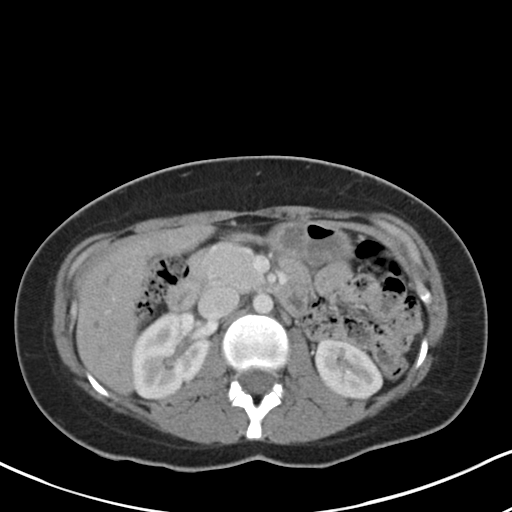
[im 63/94  bone]
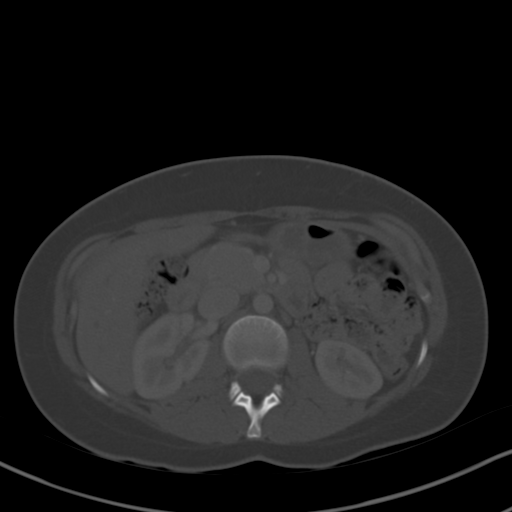
[im 66/94  soft-tissue]
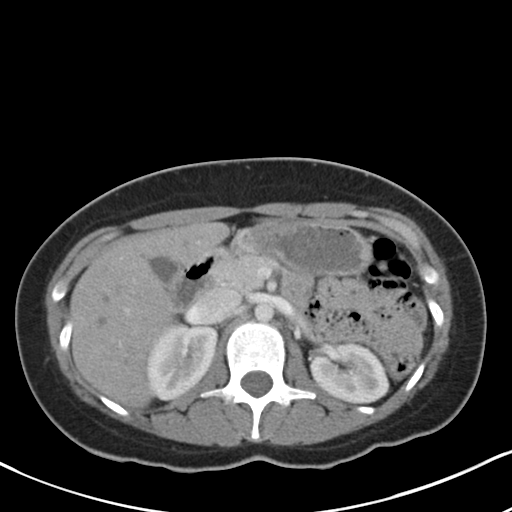
[im 74/94  soft-tissue]
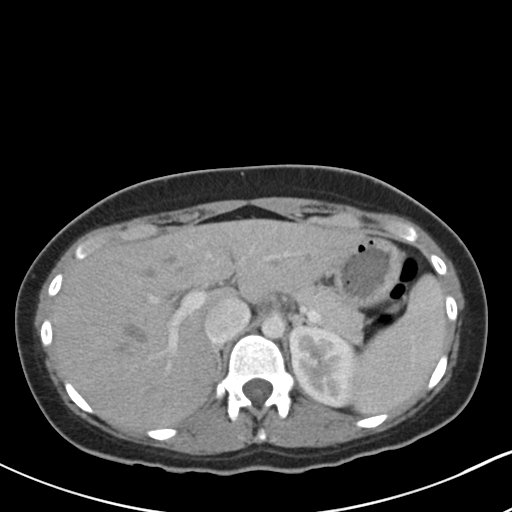
[im 82/94  soft-tissue]
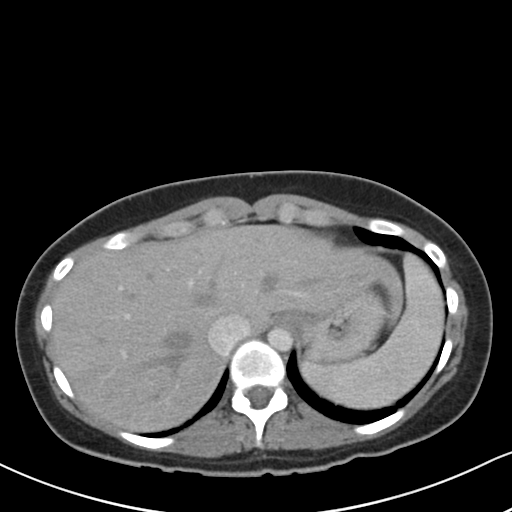
[im 90/94  soft-tissue]
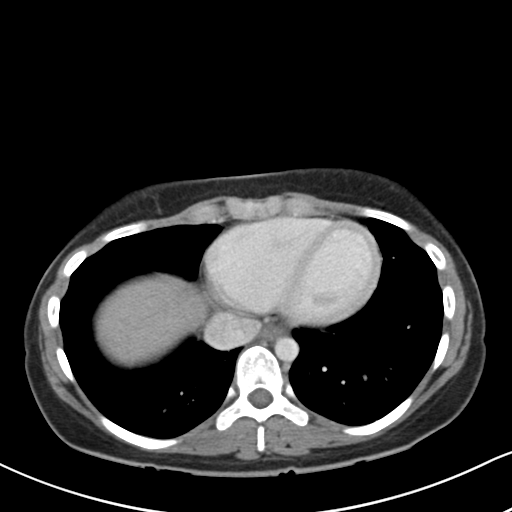

[Series 4: coronal a/|p · coronal · 0.62mm/px · 3 of 96 slices shown]
[im 32/96  soft-tissue]
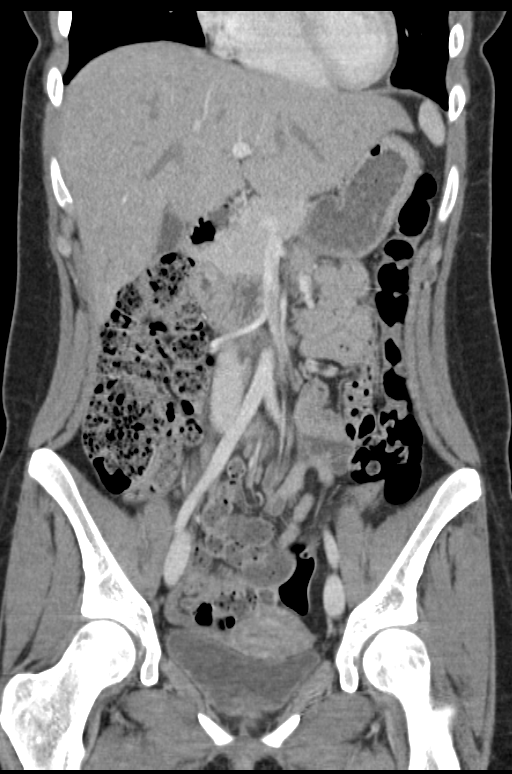
[im 43/96  soft-tissue]
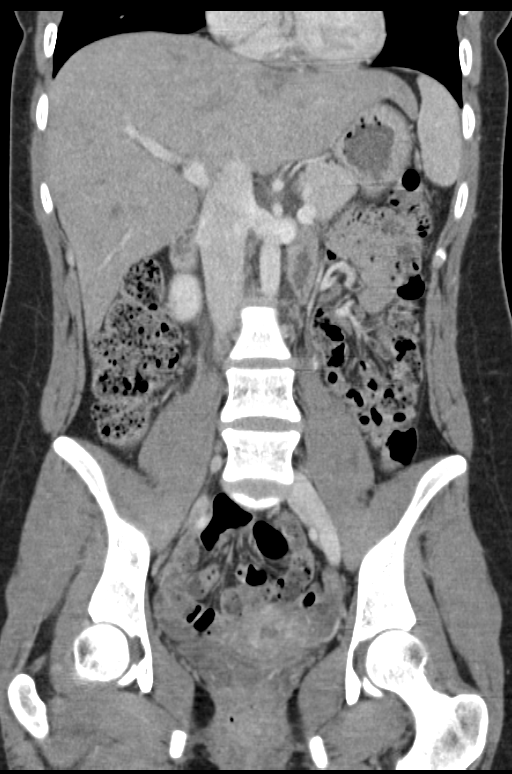
[im 53/96  soft-tissue]
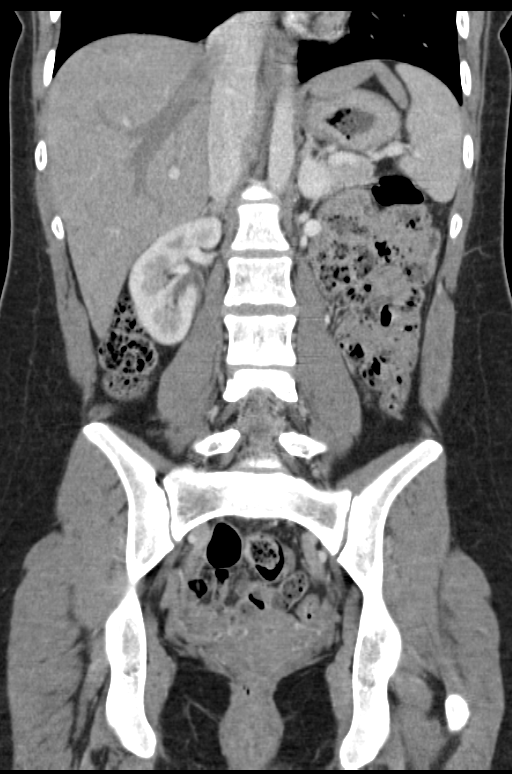

[16 of 46 positions shown; findings below may reference images not displayed]

FINDINGS: Probable visualization of the normal appendix. No inflammatory
changes in the region of the cecal tip. No acute inflammatory
changes elsewhere in the abdomen or pelvis. There is no bowel
obstruction. There is no extraluminal air.

There are unremarkable appearances of the liver, biliary system,
pancreas, spleen, adrenals and kidneys. Vasculature is unremarkable.
There is no significant abnormality in the lower chest. There is no
significant musculoskeletal abnormality. No urinary calculi.
IMPRESSION: No acute findings are evident in the abdomen or pelvis. Probable
visualization of a normal appendix. No urinary calculi.

## 2017-11-01 ENCOUNTER — Ambulatory Visit (HOSPITAL_COMMUNITY)
Admission: EM | Admit: 2017-11-01 | Discharge: 2017-11-01 | Disposition: A | Discharge status: Home / Self Care | Payer: BC Managed Care – PPO | Attending: Internal Medicine | Admitting: Internal Medicine

## 2017-11-01 ENCOUNTER — Encounter (HOSPITAL_COMMUNITY): Payer: Self-pay | Admitting: Family Medicine

## 2017-11-01 DIAGNOSIS — J069 Acute upper respiratory infection, unspecified: Secondary | ICD-10-CM | POA: Diagnosis not present

## 2017-11-01 NOTE — Discharge Instructions (Signed)
Push fluids to ensure adequate hydration and keep secretions thin. Tylenol and/or ibuprofen as needed for pain or fevers.  May restart your flonase, use of mucinex etc as needed for symptoms. If symptoms worsen or do not improve in the next week to return to be seen or to follow up with PCP.

## 2017-11-01 NOTE — ED Provider Notes (Signed)
[facility]    CSN: [id number] Arrival date & time: 11/01/17  1849     History   Chief Complaint Chief Complaint  Patient presents with  . Cough    HPI Jacqueline Barrett is a 21 y.o. female.   Jacqueline Barrett presents with complaints of cough body aches and nausea which has been ongoing for the past 1.5 week. She is still on amoxicillin for sinus infection. She feels sinus symptoms have mildly improved but cough has persisted. Last night and tonight with brief episodes of dizziness and nausea, this has resolved. tmax of 99. Without vomiting or diarrhea, without abdominal pain. Mild ear and throat pain. No known ill contacts. Without shortness of breath or chest pain. Cough is productive at times. She is on birth control, lmp 10/26, denies change of pregnancy.   ROS per HPI.       History reviewed. No pertinent past medical history.  There are no active problems to display for this patient.   History reviewed. No pertinent surgical history.  OB History    No data available       Home Medications    Prior to Admission medications   Medication Sig Start Date End Date Taking? Authorizing Provider  fluticasone (FLONASE) 50 MCG/ACT nasal spray Place 2 sprays into both nostrils daily. 03/01/17   Doristine BosworthStallings, Zoe A, MD  Levonorgestrel-Ethinyl Estradiol (AMETHIA,CAMRESE) 0.15-0.03 &0.01 MG tablet Take 1 tablet by mouth daily. 06/29/17   Ethelda ChickSmith, Kristi M, MD    Family History Family History  Problem Relation Age of Onset  . Mental illness Father        bipolar  . Diabetes Father   . Arthritis Father   . Heart disease Maternal Grandmother   . Cancer Maternal Grandmother   . Heart disease Maternal Grandfather   . Cancer Paternal Grandmother   . Hypertension Paternal Grandmother     Social History Social History   Tobacco Use  . Smoking status: Never Smoker  . Smokeless tobacco: Never Used  Substance Use Topics  . Alcohol use: No  . Drug use: No     Allergies     Patient has no known allergies.   Review of Systems Review of Systems   Physical Exam Triage Vital Signs ED Triage Vitals [11/01/17 1928]  Enc Vitals Group     BP 107/62     Pulse Rate 73     Resp 18     Temp 98.1 F (36.7 C)     Temp src      SpO2 98 %     Weight      Height      Head Circumference      Peak Flow      Pain Score      Pain Loc      Pain Edu?      Excl. in GC?    No data found.  Updated Vital Signs BP 107/62   Pulse 73   Temp 98.1 F (36.7 C)   Resp 18   LMP 10/18/2017   SpO2 98%   Visual Acuity Right Eye Distance:   Left Eye Distance:   Bilateral Distance:    Right Eye Near:   Left Eye Near:    Bilateral Near:     Physical Exam  Constitutional: She is oriented to person, place, and time. She appears well-developed and well-nourished. No distress.  HENT:  Head: Normocephalic and atraumatic.  Right Ear: External ear and ear canal normal.  A middle ear effusion is present.  Left Ear: External ear and ear canal normal. A middle ear effusion is present.  Nose: Nose normal.  Mouth/Throat: Uvula is midline and mucous membranes are normal. Posterior oropharyngeal erythema present. No tonsillar exudate.  Eyes: Conjunctivae and EOM are normal. Pupils are equal, round, and reactive to light.  Cardiovascular: Normal rate, regular rhythm and normal heart sounds.  Pulmonary/Chest: Effort normal and breath sounds normal.  Strong dry cough noted  Neurological: She is alert and oriented to person, place, and time.  Skin: Skin is warm and dry.  Vitals reviewed.    UC Treatments / Results  Labs (all labs ordered are listed, but only abnormal results are displayed) Labs Reviewed - No data to display  EKG  EKG Interpretation None       Radiology No results found.  Procedures Procedures (including critical care time)  Medications Ordered in UC Medications - No data to display   Initial Impression / Assessment and Plan / UC Course  I  have reviewed the triage vital signs and the nursing notes.  Pertinent labs & imaging results that were available during my care of the patient were reviewed by me and considered in my medical decision making (see chart for details).     Vitals stable, without fever, tachypnea hypoxia or tachycardia. Non toxic in appearance. Lungs clear. Taking amoxicillin for sinuses. Complete course. Continue with supportive cares for symptoms. Push fluids to ensure adequate hydration and keep secretions thin. Tylenol and/or ibuprofen as needed for pain or fevers. OTC treatments as needed. If symptoms worsen or do not improve in the next week to return to be seen or to follow up with PCP. Patient verbalized understanding and agreeable to plan.    Final Clinical Impressions(s) / UC Diagnoses   Final diagnoses:  Upper respiratory tract infection, unspecified type    ED Discharge Orders    None       Controlled Substance Prescriptions [place] Controlled Substance Registry consulted? Not Applicable   Georgetta HaberBurky, Naevia Unterreiner B, NP 11/01/17 2028

## 2017-11-01 NOTE — ED Triage Notes (Signed)
Pt here for cough and URI symptoms that started today.

## 2017-11-18 ENCOUNTER — Other Ambulatory Visit: Payer: Self-pay

## 2017-11-18 ENCOUNTER — Encounter: Payer: Self-pay | Admitting: Family Medicine

## 2017-11-18 ENCOUNTER — Ambulatory Visit: Payer: BC Managed Care – PPO | Admitting: Family Medicine

## 2017-11-18 ENCOUNTER — Ambulatory Visit (INDEPENDENT_AMBULATORY_CARE_PROVIDER_SITE_OTHER): Payer: BC Managed Care – PPO

## 2017-11-18 VITALS — BP 120/58 | HR 127 | Temp 98.4°F | Ht 68.11 in | Wt 160.0 lb

## 2017-11-18 DIAGNOSIS — A09 Infectious gastroenteritis and colitis, unspecified: Secondary | ICD-10-CM | POA: Diagnosis not present

## 2017-11-18 DIAGNOSIS — R05 Cough: Secondary | ICD-10-CM

## 2017-11-18 DIAGNOSIS — R059 Cough, unspecified: Secondary | ICD-10-CM

## 2017-11-18 LAB — POCT URINALYSIS DIP (MANUAL ENTRY)
Bilirubin, UA: NEGATIVE
GLUCOSE UA: NEGATIVE mg/dL
LEUKOCYTES UA: NEGATIVE
Nitrite, UA: NEGATIVE
PROTEIN UA: NEGATIVE mg/dL
Spec Grav, UA: 1.015 (ref 1.010–1.025)
UROBILINOGEN UA: 0.2 U/dL
pH, UA: 6 (ref 5.0–8.0)

## 2017-11-18 NOTE — Patient Instructions (Addendum)
   IF you received an x-ray today, you will receive an invoice from [facility]. Please contact [facility] at [phone] with questions or concerns regarding your invoice.   IF you received labwork today, you will receive an invoice from [facility]. Please contact [facility] at [phone] with questions or concerns regarding your invoice.   Our billing staff will not be able to assist you with questions regarding bills from these companies.  You will be contacted with the lab results as soon as they are available. The fastest way to get your results is to activate your My Chart account. Instructions are located on the last page of this paperwork. If you have not heard from us regarding the results in 2 weeks, please contact this office.     Diarrhea, Adult Diarrhea is frequent loose and watery bowel movements. Diarrhea can make you feel weak and cause you to become dehydrated. Dehydration can make you tired and thirsty, cause you to have a dry mouth, and decrease how often you urinate. Diarrhea typically lasts 2-3 days. However, it can last longer if it is a sign of something more serious. It is important to treat your diarrhea as told by your health care provider. Follow these instructions at home: Eating and drinking  Follow these recommendations as told by your health care provider:  Take an oral rehydration solution (ORS). This is a drink that is sold at pharmacies and retail stores.  Drink clear fluids, such as water, ice chips, diluted fruit juice, and low-calorie sports drinks.  Eat bland, easy-to-digest foods in small amounts as you are able. These foods include bananas, applesauce, rice, lean meats, toast, and crackers.  Avoid drinking fluids that contain a lot of sugar or caffeine, such as energy drinks, sports drinks, and soda.  Avoid alcohol.  Avoid spicy or fatty foods.  General instructions  Drink enough fluid to keep your urine clear or pale  yellow.  Wash your hands often. If soap and water are not available, use hand sanitizer.  Make sure that all people in your household wash their hands well and often.  Take over-the-counter and prescription medicines only as told by your health care provider.  Rest at home while you recover.  Watch your condition for any changes.  Take a warm bath to relieve any burning or pain from frequent diarrhea episodes.  Keep all follow-up visits as told by your health care provider. This is important. Contact a health care provider if:  You have a fever.  Your diarrhea gets worse.  You have new symptoms.  You cannot keep fluids down.  You feel light-headed or dizzy.  You have a headache  You have muscle cramps. Get help right away if:  You have chest pain.  You feel extremely weak or you faint.  You have bloody or black stools or stools that look like tar.  You have severe pain, cramping, or bloating in your abdomen.  You have trouble breathing or you are breathing very quickly.  Your heart is beating very quickly.  Your skin feels cold and clammy.  You feel confused.  You have signs of dehydration, such as: ? Dark urine, very little urine, or no urine. ? Cracked lips. ? Dry mouth. ? Sunken eyes. ? Sleepiness. ? Weakness. This information is not intended to replace advice given to you by your health care provider. Make sure you discuss any questions you have with your health care provider. Document Released: 11/30/2002 Document Revised: 04/19/2016 Document   Reviewed: 08/16/2015 Elsevier Interactive Patient Education  2017 [employer]

## 2017-11-18 NOTE — Progress Notes (Signed)
Subjective:    Patient ID: Jacqueline Barrett, female    DOB: [birth year], 21 y.o.   MRN: [id number]  11/18/2017  Cough (2  MONTH); Diarrhea (1 DAY); and Sore Throat (2 MONTHS)    HPI This 21 y.o. female presents for evaluation of persistent and ongoing cough.  +PND and cough.  Moved to chest.    Went to [facility] two months ago; prescribed antbiotics/Amoxicillin without improvement.  Nothing changed.  Three weeks ago, going to a wedding and got dizzy and fever. Went to Urgent Care; no testing; supportive care. Fever resolved yet cough and congestion never improved.  Then yesterday, had diarrhea and fever.   Tmax 100.0; 99 this morning.  +chills and sweats yesterdya; none today.  Took Advil.  +HA.  No recent ear pain yet had ear pian during illness; thought dizziness due to ear pressure.  No sore throat yet has been a symptoms.  No rhinorrhea; more nasal congestion; PND; dark yellow. Deep cough; rattles.  Hurts to cough.  Chest pressure with coughing or breathing. No asthma.  Did take Flonase with Amoxicillin.  +nausea yesterday; no vomiting.  +diarrhea yesterday and this morning.  Took Imodium x 1 yesterday with improvement.  Non-bloody.  Six times yesterday; today 2-3 times.  Abdominal cramping with diarrhea.  Drinking water and gatorade today; two bottles.  No dizziness now. [place]ing [place] in [place] in cabin.  Running water. No recent abx; no recent travel.   Changed to a different OCP for cost.  Some spotting; no sexual activity.   BP Readings from Last 3 Encounters:  11/18/17 (!) 120/58  11/01/17 107/62  06/29/17 122/76   Wt Readings from Last 3 Encounters:  11/18/17 160 lb (72.6 kg)  06/29/17 166 lb (75.3 kg)  03/01/17 167 lb (75.8 kg) (91 %, Z= 1.31)*   * Growth percentiles are based on CDC (Girls, 2-20 Years) data.   Immunization History  Administered Date(s) Administered  . Influenza,inj,Quad PF,6+ Mos 11/14/2016    Review of Systems  Constitutional: Positive for  fever. Negative for chills, diaphoresis and fatigue.  HENT: Positive for congestion, postnasal drip, sinus pressure and sinus pain. Negative for ear pain, rhinorrhea, sore throat and trouble swallowing.   Respiratory: Negative for cough and shortness of breath.   Cardiovascular: Negative for chest pain, palpitations and leg swelling.  Gastrointestinal: Positive for diarrhea and nausea. Negative for abdominal pain, constipation and vomiting.    History reviewed. No pertinent past medical history. History reviewed. No pertinent surgical history. No Known Allergies Current Outpatient Medications on File Prior to Visit  Medication Sig Dispense Refill  . Levonorgestrel-Ethinyl Estradiol (AMETHIA,CAMRESE) 0.15-0.03 &0.01 MG tablet Take 1 tablet by mouth daily. 1 Package 4  . fluticasone (FLONASE) 50 MCG/ACT nasal spray Place 2 sprays into both nostrils daily. (Patient not taking: Reported on 11/18/2017) 16 g 6   No current facility-administered medications on file prior to visit.    Social History   Socioeconomic History  . Marital status: Single    Spouse name: Not on file  . Number of children: Not on file  . Years of education: Not on file  . Highest education level: Not on file  Social Needs  . Financial resource strain: Not on file  . Food insecurity - worry: Not on file  . Food insecurity - inability: Not on file  . Transportation needs - medical: Not on file  . Transportation needs - non-medical: Not on file  Occupational History  . Not  on file  Tobacco Use  . Smoking status: Never Smoker  . Smokeless tobacco: Never Used  Substance and Sexual Activity  . Alcohol use: No  . Drug use: No  . Sexual activity: No  Other Topics Concern  . 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      Tobacco; none      Alcohol: socially      Drugs: none      Seatbelt: 100%; no texting      Exercise: running; two days per week.      Sexual activity:    Family History  Problem Relation Age of Onset  . Mental illness Father        bipolar  . Diabetes Father   . Arthritis Father   . Heart disease Maternal Grandmother   . Cancer Maternal Grandmother   . Heart disease Maternal Grandfather   . Cancer Paternal Grandmother   . Hypertension Paternal Grandmother        Objective:    BP (!) 120/58 (BP Location: Left Arm, Patient Position: Sitting, Cuff Size: Normal)   Pulse (!) 127   Temp 98.4 F (36.9 C) (Oral)   Ht 5' 8.11" (1.73 m)   Wt 160 lb (72.6 kg)   LMP 11/13/2017 (Approximate)   SpO2 98%   BMI 24.25 kg/m  Physical Exam  Constitutional: She is oriented to person, place, and time. She appears well-developed and well-nourished. No distress.  HENT:  Head: Normocephalic and atraumatic.  Right Ear: Tympanic membrane, external ear and ear canal normal.  Left Ear: Tympanic membrane, external ear and ear canal normal.  Nose: Mucosal edema and rhinorrhea present. Right sinus exhibits maxillary sinus tenderness. Right sinus exhibits no frontal sinus tenderness. Left sinus exhibits maxillary sinus tenderness. Left sinus exhibits no frontal sinus tenderness.  Mouth/Throat: Uvula is midline, oropharynx is clear and moist and mucous membranes are normal.  Eyes: Conjunctivae are normal. Pupils are equal, round, and reactive to light.  Neck: Normal range of motion. Neck supple.  Cardiovascular: Normal rate, regular rhythm and normal heart sounds. Exam reveals no gallop and no friction rub.  No murmur heard. Pulmonary/Chest: Effort normal and breath sounds normal. She has no wheezes. She has no rales.  Neurological: She is alert and oriented to person, place, and time.  Skin: She is not diaphoretic.  Psychiatric: She has a normal  mood and affect. Her behavior is normal.  Nursing note and vitals reviewed.  No results found. [facility] screen [facility] 11/18/2017 06/29/2017 03/01/2017 11/14/2016 05/02/2016  Decreased Interest 0 0 0 0 0  Down, Depressed, Hopeless 0 0 0 0 0  PHQ - 2 Score 0 0 0 0 0   Fall Risk  11/18/2017 06/29/2017 03/01/2017 11/14/2016 05/02/2016  Falls in the past year? No No No No No    Results for orders placed or performed in visit on 11/18/17  POCT urinalysis dipstick  Result Value Ref Range   Color, UA yellow yellow   Clarity, UA cloudy (A) clear   Glucose, UA negative negative mg/dL   Bilirubin, UA negative negative   Ketones, POC UA small (15) (A) negative mg/dL   Spec Grav, UA 4.0981.015 1.1911.010 - 1.025  Blood, UA moderate (A) negative   pH, UA 6.0 5.0 - 8.0   Protein Ur, POC negative negative mg/dL   Urobilinogen, UA 0.2 0.2 or 1.0 E.U./dL   Nitrite, UA Negative Negative   Leukocytes, UA Negative Negative    Orthostatic VS for the past 24 hrs:  BP- Lying Pulse- Lying BP- Sitting Pulse- Sitting BP- Standing at 0 minutes Pulse- Standing at 0 minutes  11/18/17 1224 110/64 78 108/64 74 100/62 78        Assessment & Plan:   1. Cough   2. Diarrhea of infectious origin    New onset cough 2 months ago.  Status post amoxicillin without improvement.  Obtain chest x-ray due to duration of cough and tachycardia upon presentation today.  Due to duration, will treat with Zithromax and prednisone for atypical bacterial infections and bronchospasm.  New onset diarrhea with fever today.  Associated nausea as well.  Consistent with gastroenteritis.  Recommend brat diet and hydration.  Due to recent amoxicillin therapy, may warrant stool studies if diarrhea persist beyond 5 days or increases in frequency.  Patient expresses understanding.  School note for today and tomorrow provided.  Orders Placed This Encounter  Procedures  . DG Chest 2 View    Standing Status:   Future    Number of Occurrences:   1     Standing Expiration Date:   11/18/2018    Order Specific Question:   Reason for Exam (SYMPTOM  OR DIAGNOSIS REQUIRED)    Answer:   cough for two months    Order Specific Question:   Is the patient pregnant?    Answer:   No    Comments:   OCP    Order Specific Question:   Preferred imaging location?    Answer:   External  . CBC with Differential/Platelet  . Comprehensive metabolic panel  . Orthostatic vital signs  . POCT urinalysis dipstick   No orders of the defined types were placed in this encounter.   No Follow-up on file.   Hinata Diener Paulita FujitaMartin Drayton Tieu, M.D. [facility]  [place] [facility] [address] [place], [place]  [id number] [phone] phone [phone] fax

## 2017-11-19 ENCOUNTER — Telehealth: Payer: Self-pay

## 2017-11-19 ENCOUNTER — Encounter: Payer: Self-pay | Admitting: Family Medicine

## 2017-11-19 LAB — COMPREHENSIVE METABOLIC PANEL
A/G RATIO: 1.6 (ref 1.2–2.2)
ALK PHOS: 47 IU/L (ref 39–117)
ALT: 107 IU/L — AB (ref 0–32)
AST: 85 IU/L — AB (ref 0–40)
Albumin: 4.5 g/dL (ref 3.5–5.5)
BILIRUBIN TOTAL: 0.3 mg/dL (ref 0.0–1.2)
BUN/Creatinine Ratio: 8 — ABNORMAL LOW (ref 9–23)
BUN: 6 mg/dL (ref 6–20)
CHLORIDE: 103 mmol/L (ref 96–106)
CO2: 21 mmol/L (ref 20–29)
Calcium: 9.5 mg/dL (ref 8.7–10.2)
Creatinine, Ser: 0.74 mg/dL (ref 0.57–1.00)
GFR calc non Af Amer: 117 mL/min/{1.73_m2} (ref >59)
GFR, EST AFRICAN AMERICAN: 135 mL/min/{1.73_m2} (ref >59)
GLUCOSE: 81 mg/dL (ref 65–99)
Globulin, Total: 2.8 g/dL (ref 1.5–4.5)
POTASSIUM: 4.1 mmol/L (ref 3.5–5.2)
Sodium: 139 mmol/L (ref 134–144)
Total Protein: 7.3 g/dL (ref 6.0–8.5)

## 2017-11-19 LAB — CBC WITH DIFFERENTIAL/PLATELET
BASOS ABS: 0 10*3/uL (ref 0.0–0.2)
Basos: 0 %
EOS (ABSOLUTE): 0.2 10*3/uL (ref 0.0–0.4)
Eos: 3 %
Hematocrit: 40.9 % (ref 34.0–46.6)
Hemoglobin: 14.1 g/dL (ref 11.1–15.9)
IMMATURE GRANS (ABS): 0 10*3/uL (ref 0.0–0.1)
Immature Granulocytes: 0 %
LYMPHS ABS: 1 10*3/uL (ref 0.7–3.1)
LYMPHS: 16 %
MCH: 30.7 pg (ref 26.6–33.0)
MCHC: 34.5 g/dL (ref 31.5–35.7)
MCV: 89 fL (ref 79–97)
MONOCYTES: 9 %
Monocytes Absolute: 0.6 10*3/uL (ref 0.1–0.9)
NEUTROS ABS: 4.2 10*3/uL (ref 1.4–7.0)
Neutrophils: 72 %
PLATELETS: 268 10*3/uL (ref 150–379)
RBC: 4.59 x10E6/uL (ref 3.77–5.28)
RDW: 14 % (ref 12.3–15.4)
WBC: 5.9 10*3/uL (ref 3.4–10.8)

## 2017-11-19 NOTE — Telephone Encounter (Signed)
Copied from CRM [id number]. Topic: Quick Communication - Other Results >> Nov 19, 2017  3:33 PM Darletta MollLander, Lumin L wrote: Notified patient regarding n/a results. Calling for lab results from yesterday including chest x-ray.

## 2017-11-20 MED ORDER — PREDNISONE 20 MG PO TABS: ORAL_TABLET | ORAL | 0 refills | Status: AC

## 2017-11-20 MED ORDER — AZITHROMYCIN 250 MG PO TABS: ORAL_TABLET | ORAL | 0 refills | Status: AC

## 2017-11-20 NOTE — Telephone Encounter (Signed)
Pt advised of labs today.

## 2018-05-09 ENCOUNTER — Encounter: Payer: Self-pay | Admitting: Family Medicine

## 2018-05-14 ENCOUNTER — Encounter: Payer: Self-pay | Admitting: Family Medicine

## 2018-07-25 ENCOUNTER — Other Ambulatory Visit: Payer: Self-pay | Admitting: Family Medicine

## 2018-07-25 NOTE — Telephone Encounter (Signed)
Attempted to contact pt regarding prescription refill; last office visit 06/29/17; no upcoming appointments noted; unable to leave message on voicemail of [phone]; pre-recorded message states the "mailbox is full"; will refill for 1 month to bridge pt until scheduling appointment.

## 2019-04-13 IMAGING — DX DG CHEST 2V
2 series · 2 of 2 positions shown · non-contrast
Comparison: None.

CLINICAL DATA: Cough for 2 months

EXAM:
CHEST  2 VIEW

[chest pa]
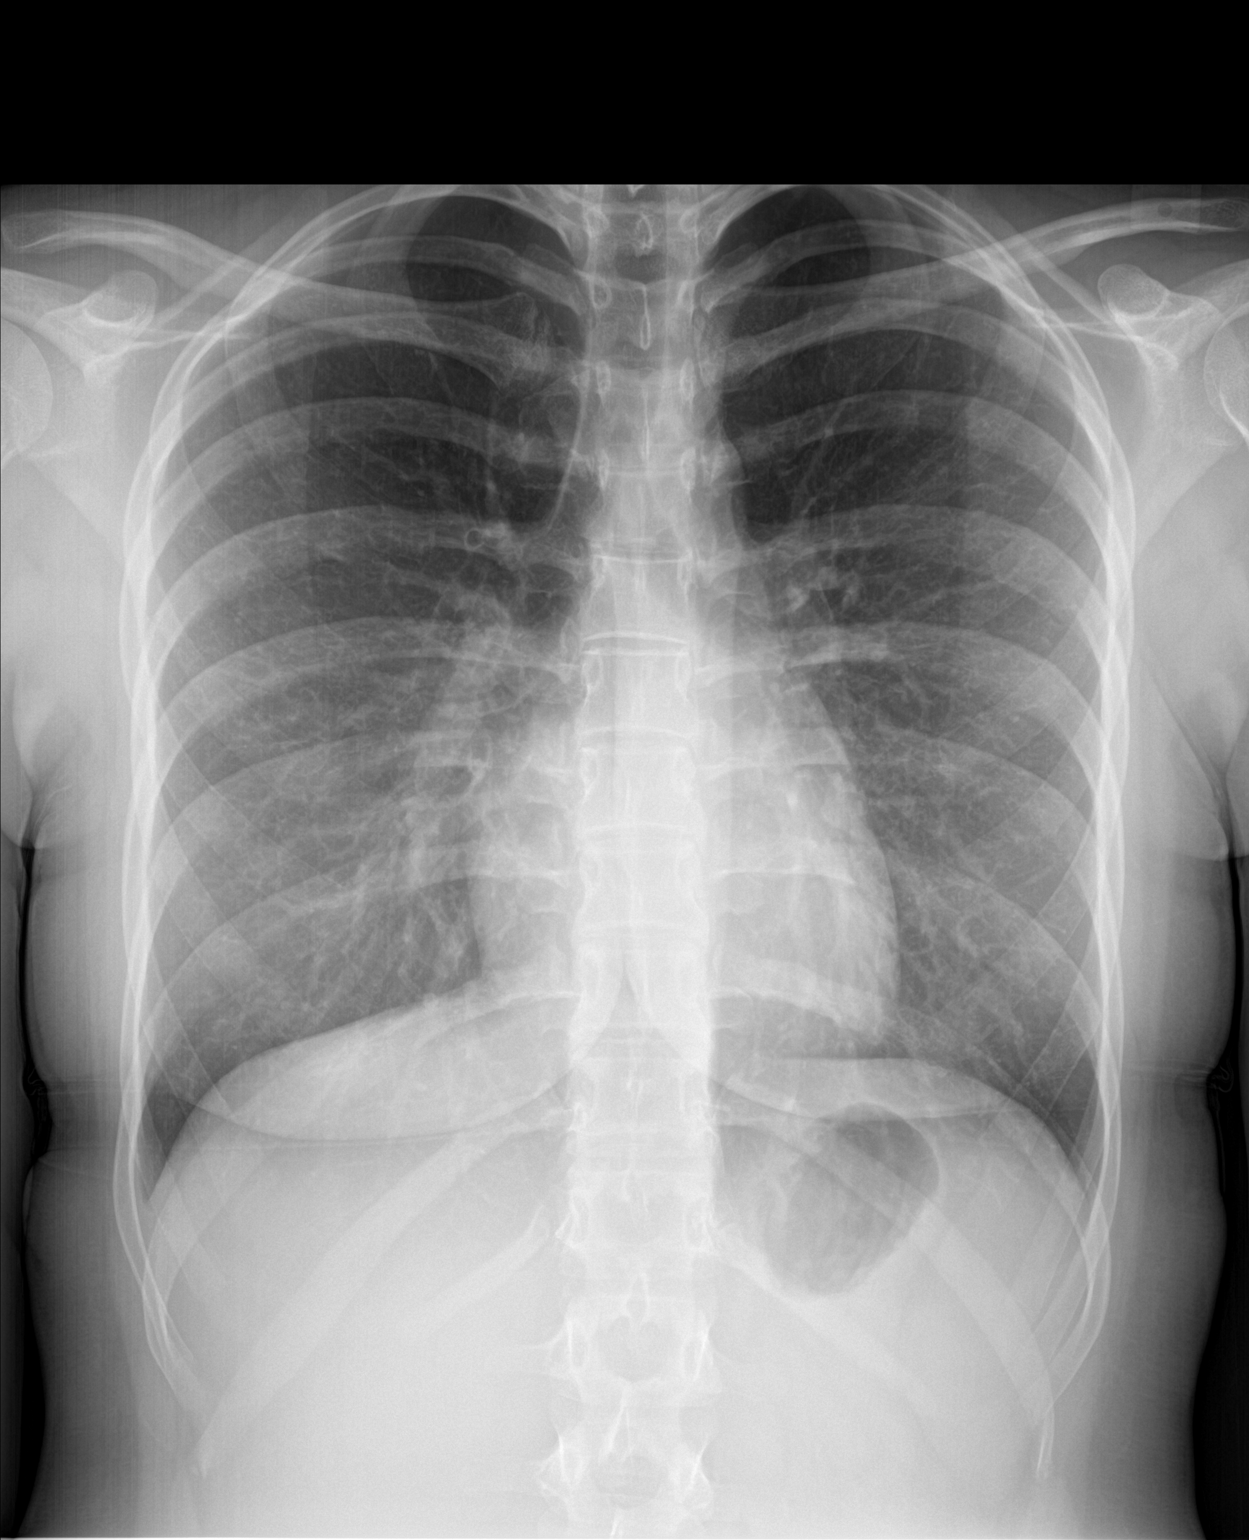

[chest lat]
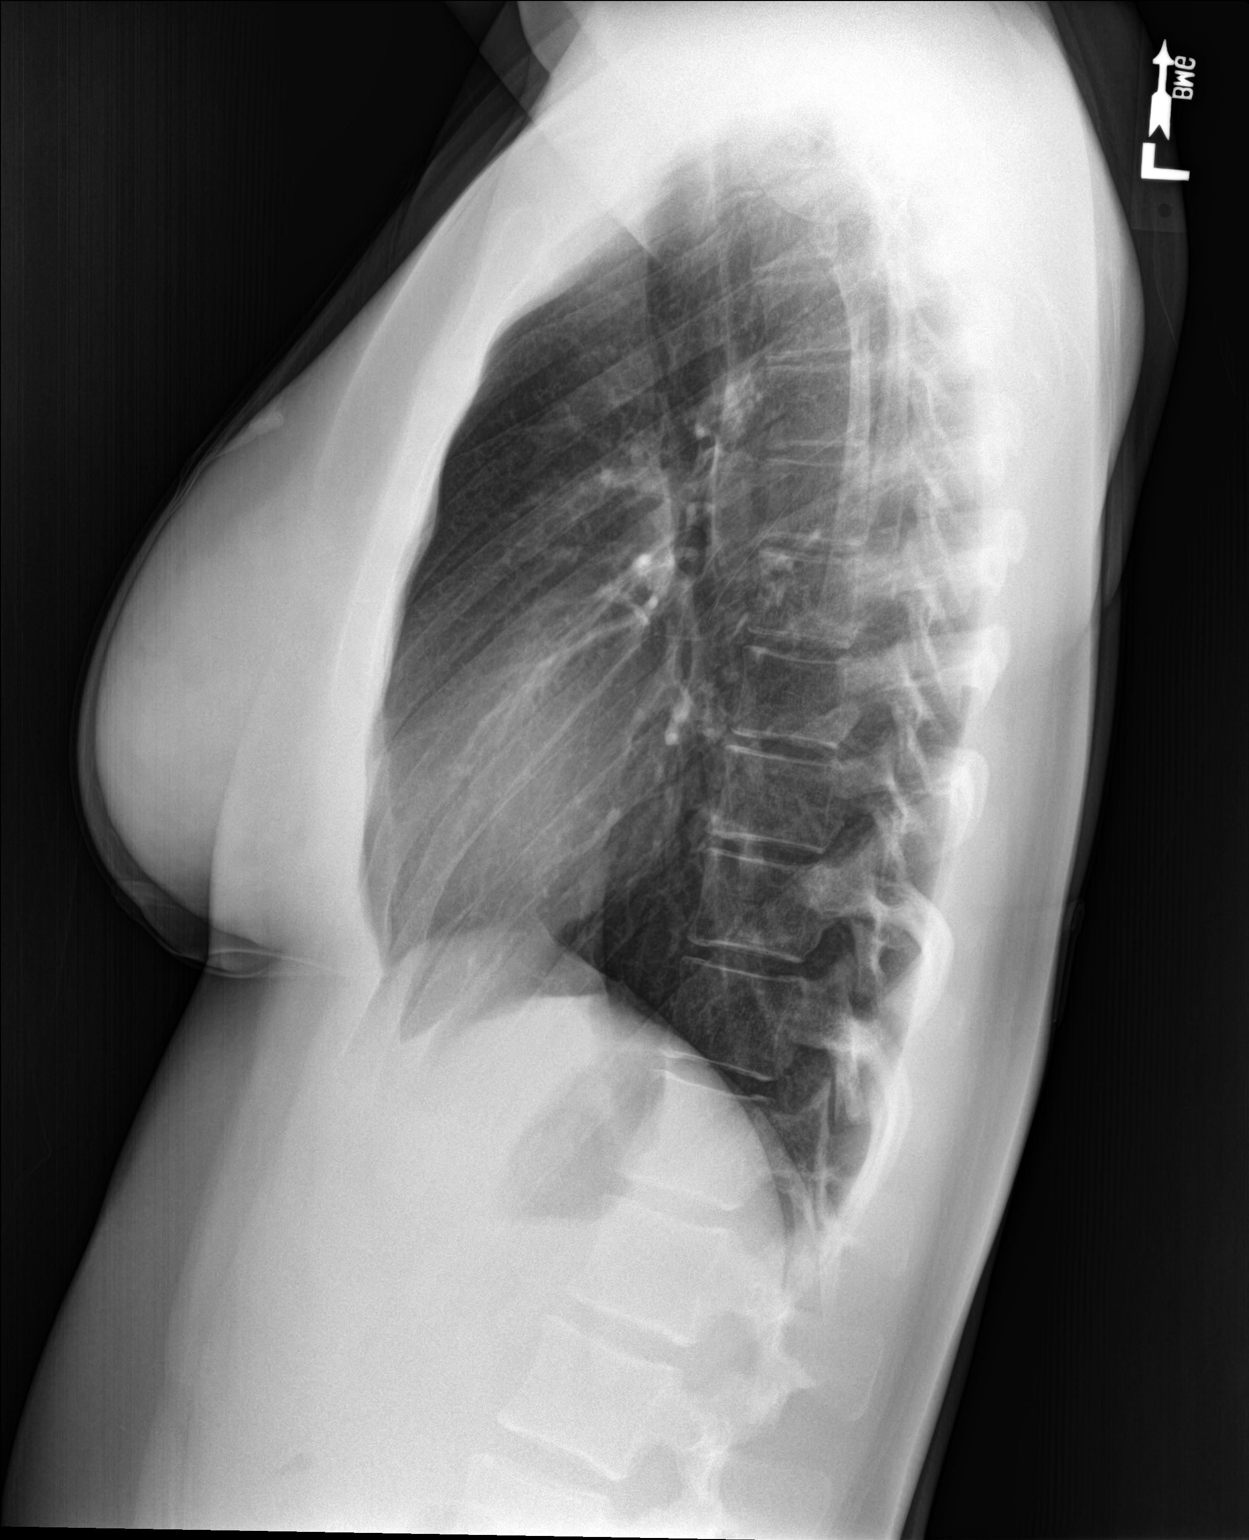

[2 of 2 positions shown; findings below may reference images not displayed]

FINDINGS: The heart size and mediastinal contours are within normal limits.
Both lungs are clear. The visualized skeletal structures are
unremarkable.
IMPRESSION: No active cardiopulmonary disease.
